# Patient Record
Sex: Male | Born: 1970 | Race: Black or African American | Hispanic: No | Marital: Single | Smoking: Former smoker
Health system: Southern US, Community
[De-identification: ages and names within clinical notes are randomized; demographics above are authoritative.]

## PROBLEM LIST (undated history)

## (undated) DIAGNOSIS — Z86718 Personal history of other venous thrombosis and embolism: Secondary | ICD-10-CM

## (undated) DIAGNOSIS — M5430 Sciatica, unspecified side: Secondary | ICD-10-CM

## (undated) DIAGNOSIS — Z21 Asymptomatic human immunodeficiency virus [HIV] infection status: Secondary | ICD-10-CM

## (undated) DIAGNOSIS — I219 Acute myocardial infarction, unspecified: Secondary | ICD-10-CM

## (undated) DIAGNOSIS — S129XXA Fracture of neck, unspecified, initial encounter: Secondary | ICD-10-CM

## (undated) DIAGNOSIS — I340 Nonrheumatic mitral (valve) insufficiency: Secondary | ICD-10-CM

## (undated) DIAGNOSIS — B20 Human immunodeficiency virus [HIV] disease: Secondary | ICD-10-CM

## (undated) DIAGNOSIS — I2510 Atherosclerotic heart disease of native coronary artery without angina pectoris: Secondary | ICD-10-CM

## (undated) HISTORY — PX: OTHER SURGICAL HISTORY: SHX169

## (undated) HISTORY — PX: NECK SURGERY: SHX720

## (undated) HISTORY — PX: CARDIAC SURGERY: SHX584

---

## 2001-12-01 ENCOUNTER — Encounter: Payer: Self-pay | Admitting: Neurosurgery

## 2001-12-01 ENCOUNTER — Ambulatory Visit (HOSPITAL_COMMUNITY): Admission: RE | Admit: 2001-12-01 | Discharge: 2001-12-02 | Payer: Self-pay | Admitting: Neurosurgery

## 2002-04-12 ENCOUNTER — Encounter: Admission: RE | Admit: 2002-04-12 | Discharge: 2002-04-16 | Payer: Self-pay | Admitting: Neurosurgery

## 2004-03-07 ENCOUNTER — Other Ambulatory Visit: Payer: Self-pay

## 2004-09-18 ENCOUNTER — Emergency Department: Payer: Self-pay | Admitting: Emergency Medicine

## 2004-12-15 ENCOUNTER — Emergency Department: Payer: Self-pay | Admitting: Emergency Medicine

## 2005-03-05 ENCOUNTER — Emergency Department: Payer: Self-pay | Admitting: Emergency Medicine

## 2005-12-24 ENCOUNTER — Emergency Department: Payer: Self-pay | Admitting: Emergency Medicine

## 2007-02-08 ENCOUNTER — Emergency Department: Payer: Self-pay | Admitting: General Practice

## 2007-02-15 ENCOUNTER — Ambulatory Visit: Payer: Self-pay | Admitting: General Practice

## 2007-04-27 ENCOUNTER — Ambulatory Visit: Payer: Self-pay | Admitting: General Practice

## 2007-05-09 ENCOUNTER — Ambulatory Visit: Payer: Self-pay | Admitting: General Practice

## 2008-01-12 ENCOUNTER — Ambulatory Visit: Payer: Self-pay | Admitting: General Practice

## 2008-01-25 ENCOUNTER — Emergency Department: Payer: Self-pay | Admitting: Emergency Medicine

## 2008-01-25 ENCOUNTER — Inpatient Hospital Stay: Payer: Self-pay | Admitting: Vascular Surgery

## 2008-01-28 ENCOUNTER — Emergency Department: Payer: Self-pay | Admitting: Emergency Medicine

## 2008-02-18 ENCOUNTER — Emergency Department: Payer: Self-pay | Admitting: Emergency Medicine

## 2008-06-07 ENCOUNTER — Ambulatory Visit: Payer: Self-pay | Admitting: General Practice

## 2009-03-10 ENCOUNTER — Emergency Department: Payer: Self-pay | Admitting: Emergency Medicine

## 2009-03-20 ENCOUNTER — Ambulatory Visit: Payer: Self-pay | Admitting: General Practice

## 2009-05-17 ENCOUNTER — Emergency Department: Payer: Self-pay | Admitting: Emergency Medicine

## 2009-08-20 IMAGING — CT CT CERVICAL SPINE WITHOUT CONTRAST
1 series · 12 of 14 positions shown, 15 images · non-contrast
Comparison: none

REASON FOR EXAM: Neck Pain  Hx Cervical Fusion
COMMENTS:

PROCEDURE:     CT  - CT CERVICAL SPINE WO  - March 20, 2009  [DATE]
RESULT:
HISTORY: Cervical spine fusion, neck pain.

[Series 6: axial · axial · 0.31mm/px · z∈[+701,+860]mm · 12 of 98 slices shown, 15 images]
[im 8/98  soft-tissue]
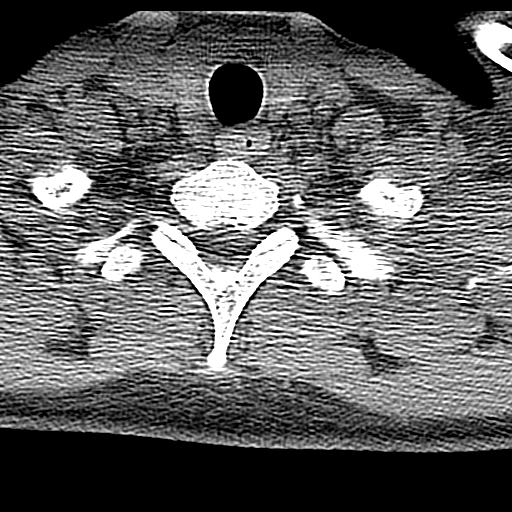
[im 8/98  bone]
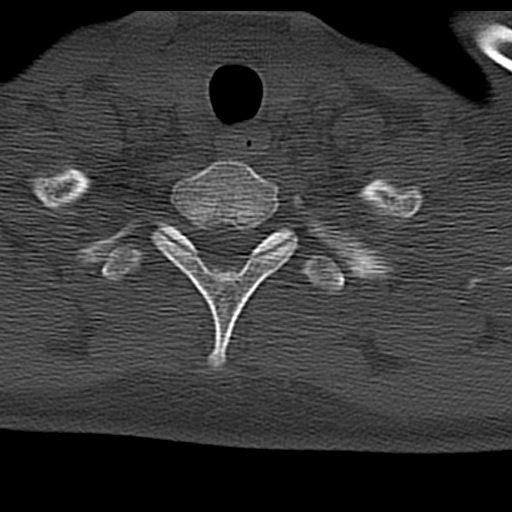
[im 15/98  bone]
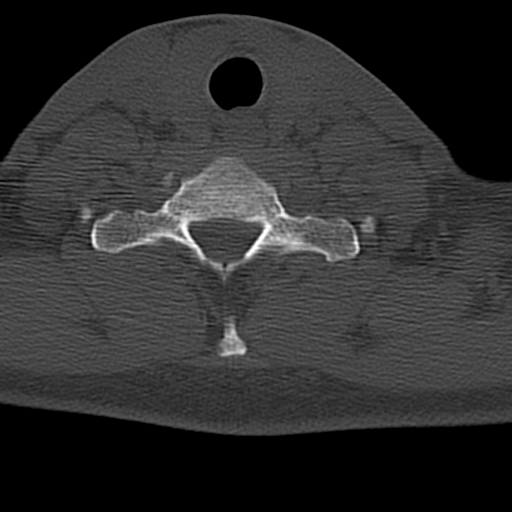
[im 23/98  bone]
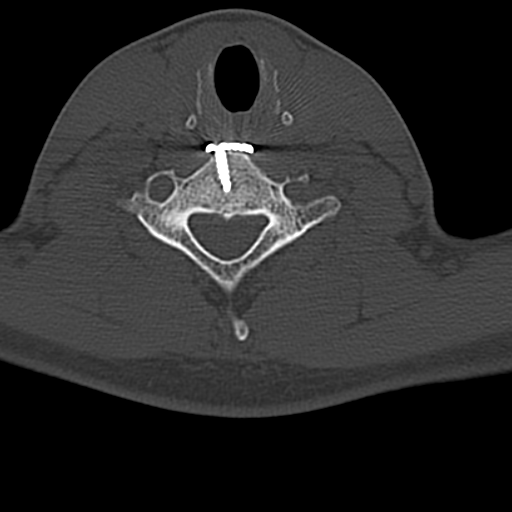
[im 30/98  bone]
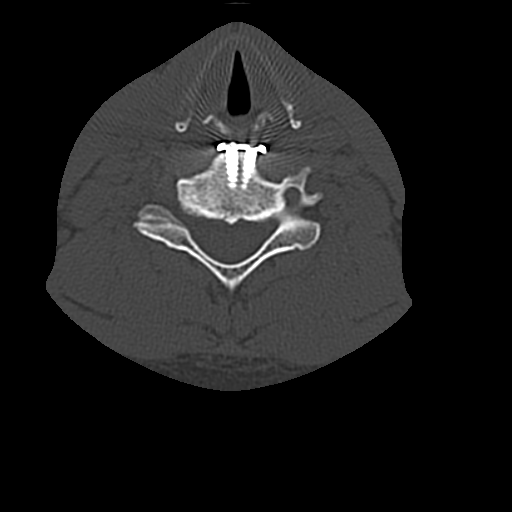
[im 38/98  soft-tissue]
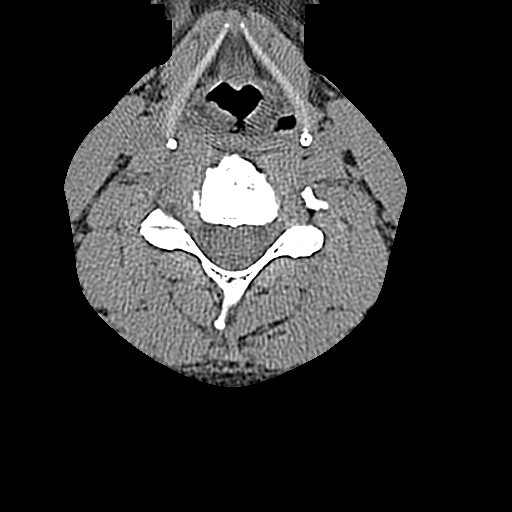
[im 38/98  bone]
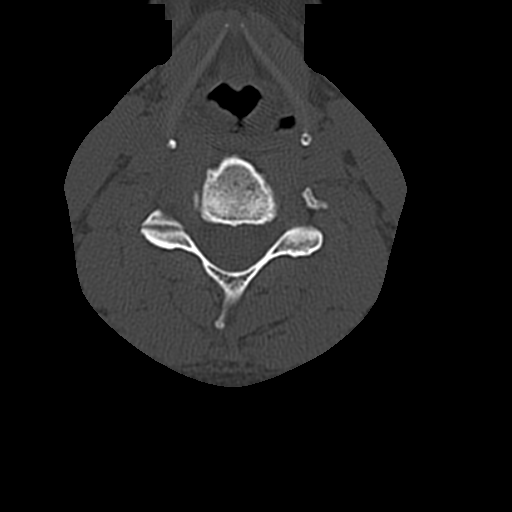
[im 45/98  bone]
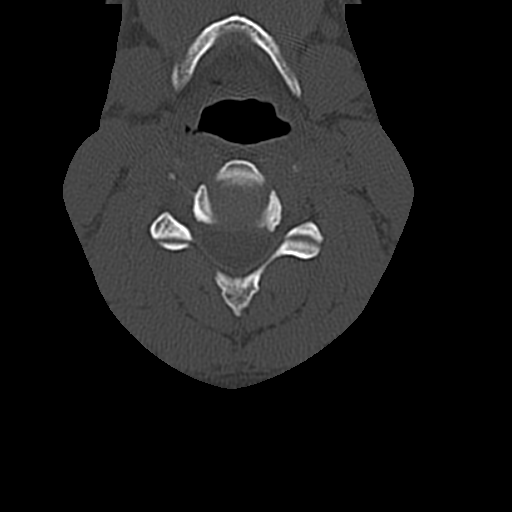
[im 53/98  bone]
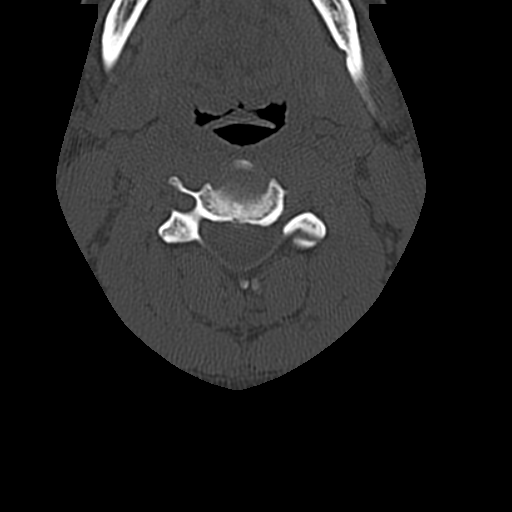
[im 60/98  bone]
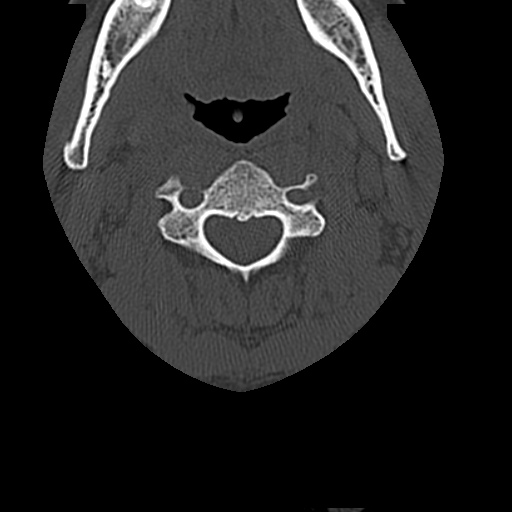
[im 68/98  soft-tissue]
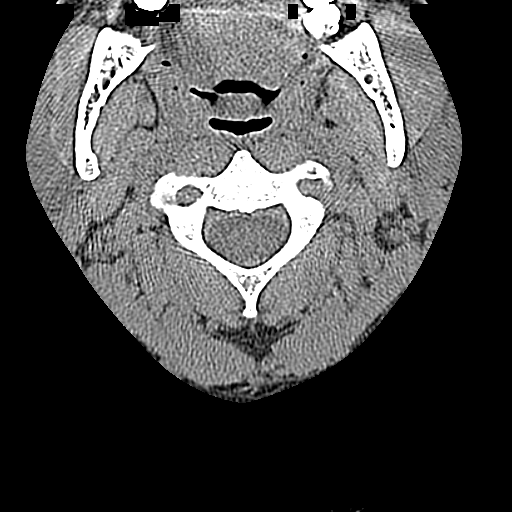
[im 68/98  bone]
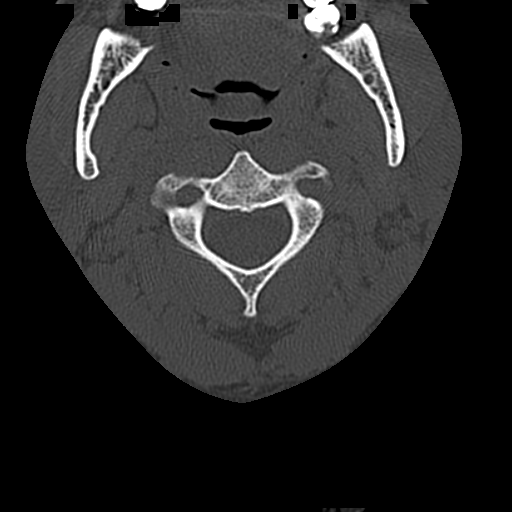
[im 75/98  bone]
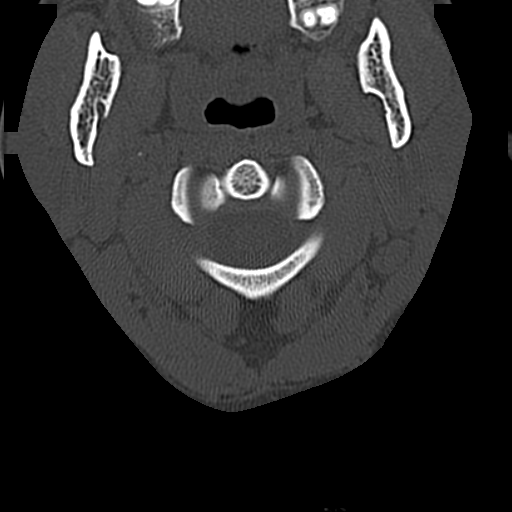
[im 83/98  bone]
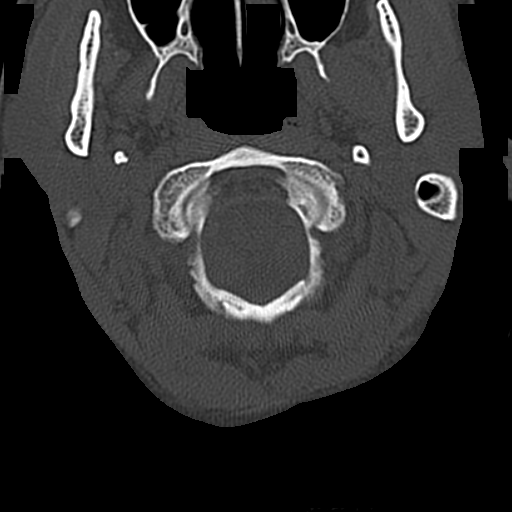
[im 90/98  bone]
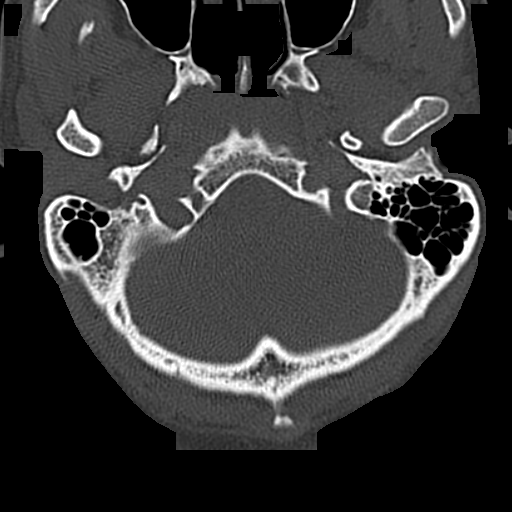

[12 of 14 positions shown; findings below may reference images not displayed]

COMPARISON STUDIES:  No recent.

PROCEDURE AND FINDINGS:  Standard CT of the cervical spine is obtained.

The patient has undergone prior C6-C7 fusion. Noted at the level of fusion
are cortical lucencies. These are most likely from the patient's prior
surgery and adjacent degenerative changes. No soft tissue swelling is noted
to suggest acute fracture. Degenerative changes are present about the
cervical spine. The neural foramina demonstrate no bony occlusion.
IMPRESSION: 1.     The patient has undergone prior C6-C7 fusion with good anatomic
alignment.
2.     Diffuse degenerative change. No acute abnormality.

## 2010-03-26 ENCOUNTER — Emergency Department: Payer: Self-pay | Admitting: Emergency Medicine

## 2010-04-14 ENCOUNTER — Emergency Department: Payer: Self-pay | Admitting: Emergency Medicine

## 2010-06-06 ENCOUNTER — Emergency Department: Payer: Self-pay | Admitting: Emergency Medicine

## 2010-09-01 ENCOUNTER — Emergency Department: Payer: Self-pay | Admitting: Emergency Medicine

## 2010-11-11 ENCOUNTER — Emergency Department: Payer: Self-pay | Admitting: Emergency Medicine

## 2011-02-09 ENCOUNTER — Emergency Department: Payer: Self-pay | Admitting: Emergency Medicine

## 2011-05-30 ENCOUNTER — Emergency Department: Payer: Self-pay | Admitting: Internal Medicine

## 2012-02-22 ENCOUNTER — Emergency Department: Payer: Self-pay | Admitting: Emergency Medicine

## 2012-02-22 LAB — BASIC METABOLIC PANEL
Anion Gap: 4 — ABNORMAL LOW (ref 7–16)
BUN: 10 mg/dL (ref 7–18)
Calcium, Total: 8.7 mg/dL (ref 8.5–10.1)
Chloride: 99 mmol/L (ref 98–107)
Co2: 30 mmol/L (ref 21–32)
Creatinine: 0.9 mg/dL (ref 0.60–1.30)
EGFR (African American): >60
EGFR (Non-African Amer.): >60
Glucose: 94 mg/dL (ref 65–99)
Osmolality: 265 (ref 275–301)
Potassium: 3.3 mmol/L — ABNORMAL LOW (ref 3.5–5.1)
Sodium: 133 mmol/L — ABNORMAL LOW (ref 136–145)

## 2012-02-22 LAB — URINALYSIS, COMPLETE
Blood: NEGATIVE
Ph: 6 (ref 4.5–8.0)
Specific Gravity: 1.038 (ref 1.003–1.030)
Squamous Epithelial: <1
WBC UR: <1 /HPF (ref 0–5)

## 2012-02-22 LAB — LIPASE, BLOOD: Lipase: 94 U/L (ref 73–393)

## 2012-02-22 LAB — APTT: Activated PTT: 27.5 secs (ref 23.6–35.9)

## 2012-02-22 LAB — PROTIME-INR: Prothrombin Time: 13.7 secs (ref 11.5–14.7)

## 2012-02-22 LAB — TROPONIN I: Troponin-I: <0.02 ng/mL

## 2012-02-22 LAB — CBC
HCT: 41.3 % (ref 40.0–52.0)
HGB: 13.6 g/dL (ref 13.0–18.0)
MCH: 27.7 pg (ref 26.0–34.0)
MCHC: 33 g/dL (ref 32.0–36.0)
MCV: 84 fL (ref 80–100)
Platelet: 118 10*3/uL — ABNORMAL LOW (ref 150–440)
RBC: 4.92 10*6/uL (ref 4.40–5.90)
RDW: 13.6 % (ref 11.5–14.5)
WBC: 4.3 10*3/uL (ref 3.8–10.6)

## 2012-02-22 LAB — CK TOTAL AND CKMB (NOT AT ARMC): CK-MB: <0.5 ng/mL — ABNORMAL LOW (ref 0.5–3.6)

## 2012-06-05 ENCOUNTER — Emergency Department: Payer: Self-pay | Admitting: Emergency Medicine

## 2012-06-05 LAB — CK TOTAL AND CKMB (NOT AT ARMC)
CK, Total: 263 U/L — ABNORMAL HIGH (ref 35–232)
CK-MB: 1.1 ng/mL (ref 0.5–3.6)

## 2012-06-05 LAB — COMPREHENSIVE METABOLIC PANEL
Albumin: 3.9 g/dL (ref 3.4–5.0)
Alkaline Phosphatase: 115 U/L (ref 50–136)
Anion Gap: 4 — ABNORMAL LOW (ref 7–16)
BUN: 12 mg/dL (ref 7–18)
Bilirubin,Total: 0.3 mg/dL (ref 0.2–1.0)
Calcium, Total: 9 mg/dL (ref 8.5–10.1)
Chloride: 104 mmol/L (ref 98–107)
Co2: 29 mmol/L (ref 21–32)
Creatinine: 1.09 mg/dL (ref 0.60–1.30)
EGFR (African American): >60
EGFR (Non-African Amer.): >60
Glucose: 83 mg/dL (ref 65–99)
Osmolality: 273 (ref 275–301)
Potassium: 4 mmol/L (ref 3.5–5.1)
SGOT(AST): 37 U/L (ref 15–37)
SGPT (ALT): 19 U/L (ref 12–78)
Sodium: 137 mmol/L (ref 136–145)
Total Protein: 9.3 g/dL — ABNORMAL HIGH (ref 6.4–8.2)

## 2012-06-05 LAB — PROTIME-INR
INR: 0.9
Prothrombin Time: 12.4 secs (ref 11.5–14.7)

## 2012-06-05 LAB — CBC
HCT: 42.6 % (ref 40.0–52.0)
HGB: 13.7 g/dL (ref 13.0–18.0)
MCH: 27.4 pg (ref 26.0–34.0)
MCHC: 32.1 g/dL (ref 32.0–36.0)
MCV: 85 fL (ref 80–100)
Platelet: 149 10*3/uL — ABNORMAL LOW (ref 150–440)
RBC: 4.99 10*6/uL (ref 4.40–5.90)
RDW: 14.1 % (ref 11.5–14.5)
WBC: 3.3 10*3/uL — ABNORMAL LOW (ref 3.8–10.6)

## 2012-06-05 LAB — TROPONIN I
Troponin-I: <0.02 ng/mL
Troponin-I: <0.02 ng/mL

## 2012-06-05 LAB — APTT: Activated PTT: 25.8 secs (ref 23.6–35.9)

## 2013-01-07 ENCOUNTER — Emergency Department: Payer: Self-pay | Admitting: Unknown Physician Specialty

## 2013-01-07 LAB — COMPREHENSIVE METABOLIC PANEL
Albumin: 3.5 g/dL (ref 3.4–5.0)
Alkaline Phosphatase: 96 U/L (ref 50–136)
Bilirubin,Total: 0.2 mg/dL (ref 0.2–1.0)
Chloride: 106 mmol/L (ref 98–107)
Co2: 28 mmol/L (ref 21–32)
Creatinine: 0.87 mg/dL (ref 0.60–1.30)
EGFR (Non-African Amer.): >60
Glucose: 105 mg/dL — ABNORMAL HIGH (ref 65–99)
Potassium: 3.4 mmol/L — ABNORMAL LOW (ref 3.5–5.1)
SGOT(AST): 36 U/L (ref 15–37)
SGPT (ALT): 21 U/L (ref 12–78)
Sodium: 139 mmol/L (ref 136–145)
Total Protein: 8.3 g/dL — ABNORMAL HIGH (ref 6.4–8.2)

## 2013-01-07 LAB — TROPONIN I: Troponin-I: <0.02 ng/mL

## 2013-01-07 LAB — APTT: Activated PTT: 28.6 secs (ref 23.6–35.9)

## 2013-01-07 LAB — CBC
HCT: 37.7 % — ABNORMAL LOW (ref 40.0–52.0)
HGB: 12.2 g/dL — ABNORMAL LOW (ref 13.0–18.0)
MCV: 86 fL (ref 80–100)
Platelet: 127 10*3/uL — ABNORMAL LOW (ref 150–440)
RBC: 4.41 10*6/uL (ref 4.40–5.90)

## 2013-01-12 LAB — CULTURE, BLOOD (SINGLE)

## 2013-12-26 ENCOUNTER — Emergency Department: Payer: Self-pay | Admitting: Emergency Medicine

## 2014-02-14 ENCOUNTER — Emergency Department: Payer: Self-pay | Admitting: Emergency Medicine

## 2014-02-14 LAB — URINALYSIS, COMPLETE
BLOOD: NEGATIVE
Bacteria: NONE SEEN
Bilirubin,UR: NEGATIVE
Glucose,UR: NEGATIVE mg/dL (ref 0–75)
Hyaline Cast: 3
LEUKOCYTE ESTERASE: NEGATIVE
Nitrite: NEGATIVE
Ph: 5 (ref 4.5–8.0)
Protein: NEGATIVE
RBC,UR: 11 /HPF (ref 0–5)
Specific Gravity: 1.024 (ref 1.003–1.030)
WBC UR: 2 /HPF (ref 0–5)

## 2014-02-14 LAB — BASIC METABOLIC PANEL
Anion Gap: 9 (ref 7–16)
BUN: 34 mg/dL — ABNORMAL HIGH (ref 7–18)
CALCIUM: 8.5 mg/dL (ref 8.5–10.1)
CHLORIDE: 103 mmol/L (ref 98–107)
Co2: 26 mmol/L (ref 21–32)
Creatinine: 0.87 mg/dL (ref 0.60–1.30)
EGFR (African American): >60
EGFR (Non-African Amer.): >60
Glucose: 87 mg/dL (ref 65–99)
OSMOLALITY: 283 (ref 275–301)
POTASSIUM: 3.7 mmol/L (ref 3.5–5.1)
Sodium: 138 mmol/L (ref 136–145)

## 2014-02-14 LAB — CBC
HCT: 27.5 % — ABNORMAL LOW (ref 40.0–52.0)
HGB: 8.8 g/dL — ABNORMAL LOW (ref 13.0–18.0)
MCH: 26.7 pg (ref 26.0–34.0)
MCHC: 31.8 g/dL — AB (ref 32.0–36.0)
MCV: 84 fL (ref 80–100)
PLATELETS: 121 10*3/uL — AB (ref 150–440)
RBC: 3.28 10*6/uL — ABNORMAL LOW (ref 4.40–5.90)
RDW: 14.3 % (ref 11.5–14.5)
WBC: 4.3 10*3/uL (ref 3.8–10.6)

## 2014-02-14 LAB — TROPONIN I: Troponin-I: <0.02 ng/mL

## 2014-02-14 LAB — LIPASE, BLOOD: Lipase: 88 U/L (ref 73–393)

## 2014-02-19 LAB — CULTURE, BLOOD (SINGLE)

## 2014-12-07 ENCOUNTER — Emergency Department: Payer: Self-pay | Admitting: Emergency Medicine

## 2016-10-11 ENCOUNTER — Encounter: Payer: Self-pay | Admitting: Emergency Medicine

## 2016-10-11 ENCOUNTER — Emergency Department
Admission: EM | Admit: 2016-10-11 | Discharge: 2016-10-12 | Disposition: A | Discharge status: Home / Self Care | Payer: Medicare Other | Attending: Emergency Medicine | Admitting: Emergency Medicine

## 2016-10-11 DIAGNOSIS — Z21 Asymptomatic human immunodeficiency virus [HIV] infection status: Secondary | ICD-10-CM | POA: Insufficient documentation

## 2016-10-11 DIAGNOSIS — M5441 Lumbago with sciatica, right side: Secondary | ICD-10-CM | POA: Insufficient documentation

## 2016-10-11 DIAGNOSIS — Z87891 Personal history of nicotine dependence: Secondary | ICD-10-CM | POA: Diagnosis not present

## 2016-10-11 DIAGNOSIS — M545 Low back pain: Secondary | ICD-10-CM | POA: Diagnosis present

## 2016-10-11 HISTORY — DX: Fracture of neck, unspecified, initial encounter: S12.9XXA

## 2016-10-11 HISTORY — DX: Personal history of other venous thrombosis and embolism: Z86.718

## 2016-10-11 HISTORY — DX: Sciatica, unspecified side: M54.30

## 2016-10-11 MED ORDER — METHYLPREDNISOLONE SODIUM SUCC 125 MG IJ SOLR
125.0000 mg | Freq: Once | INTRAMUSCULAR | Status: AC
  Administered 2016-10-11: 125 mg via INTRAVENOUS
  Filled 2016-10-11: qty 2

## 2016-10-11 MED ORDER — HYDROMORPHONE HCL 1 MG/ML IJ SOLN
1.0000 mg | Freq: Once | INTRAMUSCULAR | Status: AC
  Administered 2016-10-11: 1 mg via INTRAVENOUS
  Filled 2016-10-11: qty 1

## 2016-10-11 MED ORDER — KETOROLAC TROMETHAMINE 30 MG/ML IJ SOLN
30.0000 mg | Freq: Once | INTRAMUSCULAR | Status: AC
  Administered 2016-10-11: 30 mg via INTRAVENOUS
  Filled 2016-10-11: qty 1

## 2016-10-11 MED ORDER — DIAZEPAM 5 MG PO TABS
5.0000 mg | ORAL_TABLET | Freq: Once | ORAL | Status: AC
  Administered 2016-10-11: 5 mg via ORAL
  Filled 2016-10-11: qty 1

## 2016-10-11 NOTE — ED Triage Notes (Signed)
Pt presents from home via [facility] with c/o mid back radiating down bilateral legs. Pt reports hx of sciatic nerve pain, reports pain has increased. Pt states took oxycotin 6 hours PTA without relief. Per EMS pt given 4 mg of Zofran and 100 mcg of Fentanyl without relief.
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
## 2016-10-12 DIAGNOSIS — M5441 Lumbago with sciatica, right side: Secondary | ICD-10-CM | POA: Diagnosis not present

## 2016-10-12 MED ORDER — DIAZEPAM 5 MG PO TABS: 5.0000 mg | ORAL_TABLET | Freq: Three times a day (TID) | ORAL | 0 refills | Status: DC | PRN

## 2016-10-12 MED ORDER — OXYCODONE-ACETAMINOPHEN 5-325 MG PO TABS: 1.0000 | ORAL_TABLET | ORAL | 0 refills | Status: DC | PRN

## 2016-10-12 MED ORDER — OXYCODONE-ACETAMINOPHEN 5-325 MG PO TABS
1.0000 | ORAL_TABLET | Freq: Once | ORAL | Status: AC
  Administered 2016-10-12: 1 via ORAL
  Filled 2016-10-12: qty 1

## 2016-10-12 MED ORDER — IBUPROFEN 800 MG PO TABS: 800.0000 mg | ORAL_TABLET | Freq: Three times a day (TID) | ORAL | 0 refills | Status: DC | PRN

## 2016-10-12 MED ORDER — METHYLPREDNISOLONE 4 MG PO TBPK: ORAL_TABLET | ORAL | 0 refills | Status: DC

## 2016-10-12 NOTE — ED Notes (Signed)

## 2016-10-12 NOTE — ED Notes (Addendum)
Upon this RN entry to room patient sleeping, lights off for patient comfort, respirations even and unlabored.

## 2016-10-12 NOTE — ED Notes (Signed)
Pt requested that his shirts be cut off instead of taking them off. This RN asked again if he really wanted white shirt and flannel cut off and pt again told this RN just to cut them off. Both shirts placed in a chair in room along with a set of keys.

## 2016-10-12 NOTE — Discharge Instructions (Signed)
1. You may take medicines as needed for pain and muscle spasms (Motrin/Percocet/Valium #15). 2. Take steroid taper as directed (Medrol Dosepak). 3. Return to the ER for worsening symptoms, persistent vomiting, difficulty breathing or other concerns.

## 2016-10-13 ENCOUNTER — Emergency Department
Admission: EM | Admit: 2016-10-13 | Discharge: 2016-10-13 | Disposition: A | Discharge status: Other Acute Inpatient Hospital | Payer: Medicare Other | Attending: Emergency Medicine | Admitting: Emergency Medicine

## 2016-10-13 ENCOUNTER — Emergency Department: Payer: Medicare Other

## 2016-10-13 ENCOUNTER — Encounter: Payer: Self-pay | Admitting: Emergency Medicine

## 2016-10-13 DIAGNOSIS — R1084 Generalized abdominal pain: Secondary | ICD-10-CM | POA: Insufficient documentation

## 2016-10-13 DIAGNOSIS — Z87891 Personal history of nicotine dependence: Secondary | ICD-10-CM | POA: Insufficient documentation

## 2016-10-13 DIAGNOSIS — Z791 Long term (current) use of non-steroidal anti-inflammatories (NSAID): Secondary | ICD-10-CM | POA: Diagnosis not present

## 2016-10-13 DIAGNOSIS — K922 Gastrointestinal hemorrhage, unspecified: Secondary | ICD-10-CM | POA: Insufficient documentation

## 2016-10-13 DIAGNOSIS — E860 Dehydration: Secondary | ICD-10-CM | POA: Diagnosis not present

## 2016-10-13 DIAGNOSIS — R531 Weakness: Secondary | ICD-10-CM

## 2016-10-13 DIAGNOSIS — N179 Acute kidney failure, unspecified: Secondary | ICD-10-CM | POA: Insufficient documentation

## 2016-10-13 DIAGNOSIS — Z21 Asymptomatic human immunodeficiency virus [HIV] infection status: Secondary | ICD-10-CM | POA: Diagnosis not present

## 2016-10-13 DIAGNOSIS — Z79899 Other long term (current) drug therapy: Secondary | ICD-10-CM | POA: Insufficient documentation

## 2016-10-13 DIAGNOSIS — K920 Hematemesis: Secondary | ICD-10-CM | POA: Diagnosis present

## 2016-10-13 HISTORY — DX: Asymptomatic human immunodeficiency virus (hiv) infection status: Z21

## 2016-10-13 HISTORY — DX: Human immunodeficiency virus (HIV) disease: B20

## 2016-10-13 LAB — COMPREHENSIVE METABOLIC PANEL
ALBUMIN: 3.7 g/dL (ref 3.5–5.0)
ALT: 17 U/L (ref 17–63)
AST: 42 U/L — AB (ref 15–41)
Alkaline Phosphatase: 62 U/L (ref 38–126)
Anion gap: 17 — ABNORMAL HIGH (ref 5–15)
BUN: 61 mg/dL — AB (ref 6–20)
CHLORIDE: 100 mmol/L — AB (ref 101–111)
CO2: 20 mmol/L — AB (ref 22–32)
CREATININE: 1.76 mg/dL — AB (ref 0.61–1.24)
Calcium: 8.9 mg/dL (ref 8.9–10.3)
GFR calc Af Amer: 51 mL/min — ABNORMAL LOW (ref >60)
GFR, EST NON AFRICAN AMERICAN: 44 mL/min — AB (ref >60)
GLUCOSE: 221 mg/dL — AB (ref 65–99)
POTASSIUM: 3.6 mmol/L (ref 3.5–5.1)
SODIUM: 137 mmol/L (ref 135–145)
Total Bilirubin: 0.9 mg/dL (ref 0.3–1.2)
Total Protein: 6.8 g/dL (ref 6.5–8.1)

## 2016-10-13 LAB — TYPE AND SCREEN
ABO/RH(D): B POS
Antibody Screen: NEGATIVE

## 2016-10-13 LAB — PROTIME-INR
INR: 1.3
Prothrombin Time: 16.3 seconds — ABNORMAL HIGH (ref 11.4–15.2)

## 2016-10-13 LAB — CBC
HEMATOCRIT: 29.2 % — AB (ref 40.0–52.0)
Hemoglobin: 9.4 g/dL — ABNORMAL LOW (ref 13.0–18.0)
MCH: 29 pg (ref 26.0–34.0)
MCHC: 32.2 g/dL (ref 32.0–36.0)
MCV: 89.9 fL (ref 80.0–100.0)
PLATELETS: 268 10*3/uL (ref 150–440)
RBC: 3.24 MIL/uL — ABNORMAL LOW (ref 4.40–5.90)
RDW: 15.7 % — AB (ref 11.5–14.5)
WBC: 18.7 10*3/uL — AB (ref 3.8–10.6)

## 2016-10-13 LAB — ETHANOL

## 2016-10-13 LAB — LIPASE, BLOOD: LIPASE: 11 U/L (ref 11–51)

## 2016-10-13 LAB — TROPONIN I: Troponin I: <0.03 ng/mL (ref <0.03)

## 2016-10-13 MED ORDER — SODIUM CHLORIDE 0.9 % IV BOLUS (SEPSIS): 1000.0000 mL | Freq: Once | INTRAVENOUS | Status: DC

## 2016-10-13 MED ORDER — FAMOTIDINE IN NACL 20-0.9 MG/50ML-% IV SOLN
20.0000 mg | Freq: Once | INTRAVENOUS | Status: AC
  Administered 2016-10-13: 20 mg via INTRAVENOUS
  Filled 2016-10-13: qty 50

## 2016-10-13 MED ORDER — SODIUM CHLORIDE 0.9 % IV BOLUS (SEPSIS)
1000.0000 mL | Freq: Once | INTRAVENOUS | Status: AC
  Administered 2016-10-13: 1000 mL via INTRAVENOUS

## 2016-10-13 MED ORDER — PANTOPRAZOLE SODIUM 40 MG IV SOLR: 40.0000 mg | Freq: Two times a day (BID) | INTRAVENOUS | Status: DC

## 2016-10-13 MED ORDER — ONDANSETRON HCL 4 MG/2ML IJ SOLN
4.0000 mg | Freq: Once | INTRAMUSCULAR | Status: AC
  Administered 2016-10-13: 4 mg via INTRAVENOUS
  Filled 2016-10-13: qty 2

## 2016-10-13 MED ORDER — SODIUM CHLORIDE 0.9 % IV SOLN
8.0000 mg/h | INTRAVENOUS | Status: DC
  Administered 2016-10-13: 8 mg/h via INTRAVENOUS
  Filled 2016-10-13: qty 80

## 2016-10-13 NOTE — ED Triage Notes (Signed)
Pt arrived to ED from home by EMS with c/o hematemesis since 1900 yesterday. EMS reports bright red blood in emesis on arrival with pt showing signs of generalized weakness. Pt has hx of blood clots, is currently taking blood thinners (unknown meds.)
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
## 2018-06-12 ENCOUNTER — Emergency Department
Admission: EM | Admit: 2018-06-12 | Discharge: 2018-06-12 | Disposition: A | Discharge status: Home / Self Care | Payer: Medicare Other | Attending: Emergency Medicine | Admitting: Emergency Medicine

## 2018-06-12 ENCOUNTER — Encounter: Payer: Self-pay | Admitting: Emergency Medicine

## 2018-06-12 DIAGNOSIS — Z87891 Personal history of nicotine dependence: Secondary | ICD-10-CM | POA: Insufficient documentation

## 2018-06-12 DIAGNOSIS — S39012A Strain of muscle, fascia and tendon of lower back, initial encounter: Secondary | ICD-10-CM | POA: Diagnosis not present

## 2018-06-12 DIAGNOSIS — Y92410 Unspecified street and highway as the place of occurrence of the external cause: Secondary | ICD-10-CM | POA: Insufficient documentation

## 2018-06-12 DIAGNOSIS — Z79899 Other long term (current) drug therapy: Secondary | ICD-10-CM | POA: Diagnosis not present

## 2018-06-12 DIAGNOSIS — Y999 Unspecified external cause status: Secondary | ICD-10-CM | POA: Insufficient documentation

## 2018-06-12 DIAGNOSIS — S3992XA Unspecified injury of lower back, initial encounter: Secondary | ICD-10-CM | POA: Diagnosis present

## 2018-06-12 DIAGNOSIS — Z7982 Long term (current) use of aspirin: Secondary | ICD-10-CM | POA: Diagnosis not present

## 2018-06-12 DIAGNOSIS — Y939 Activity, unspecified: Secondary | ICD-10-CM | POA: Diagnosis not present

## 2018-06-12 HISTORY — DX: Nonrheumatic mitral (valve) insufficiency: I34.0

## 2018-06-12 NOTE — Discharge Instructions (Signed)
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
## 2018-09-26 ENCOUNTER — Emergency Department
Admission: EM | Admit: 2018-09-26 | Discharge: 2018-09-26 | Disposition: A | Discharge status: Home / Self Care | Payer: Medicare Other | Attending: Emergency Medicine | Admitting: Emergency Medicine

## 2018-09-26 ENCOUNTER — Other Ambulatory Visit: Payer: Self-pay

## 2018-09-26 ENCOUNTER — Encounter: Payer: Self-pay | Admitting: Emergency Medicine

## 2018-09-26 ENCOUNTER — Emergency Department: Payer: Medicare Other

## 2018-09-26 DIAGNOSIS — R079 Chest pain, unspecified: Secondary | ICD-10-CM | POA: Diagnosis present

## 2018-09-26 DIAGNOSIS — Z5321 Procedure and treatment not carried out due to patient leaving prior to being seen by health care provider: Secondary | ICD-10-CM | POA: Diagnosis not present

## 2018-09-26 HISTORY — DX: Atherosclerotic heart disease of native coronary artery without angina pectoris: I25.10

## 2018-09-26 HISTORY — DX: Acute myocardial infarction, unspecified: I21.9

## 2018-09-26 LAB — CBC
HCT: 49 % (ref 39.0–52.0)
Hemoglobin: 15.7 g/dL (ref 13.0–17.0)
MCH: 29.6 pg (ref 26.0–34.0)
MCHC: 32 g/dL (ref 30.0–36.0)
MCV: 92.3 fL (ref 80.0–100.0)
NRBC: 0 % (ref 0.0–0.2)
Platelets: 280 10*3/uL (ref 150–400)
RBC: 5.31 MIL/uL (ref 4.22–5.81)
RDW: 13.3 % (ref 11.5–15.5)
WBC: 6.3 10*3/uL (ref 4.0–10.5)

## 2018-09-26 LAB — BASIC METABOLIC PANEL
Anion gap: 9 (ref 5–15)
BUN: 18 mg/dL (ref 6–20)
CALCIUM: 9.4 mg/dL (ref 8.9–10.3)
CO2: 24 mmol/L (ref 22–32)
CREATININE: 1.1 mg/dL (ref 0.61–1.24)
Chloride: 104 mmol/L (ref 98–111)
GFR calc non Af Amer: >60 mL/min (ref >60)
Glucose, Bld: 110 mg/dL — ABNORMAL HIGH (ref 70–99)
Potassium: 4.2 mmol/L (ref 3.5–5.1)
Sodium: 137 mmol/L (ref 135–145)

## 2018-09-26 LAB — TROPONIN I: Troponin I: <0.03 ng/mL (ref <0.03)

## 2018-09-26 NOTE — ED Triage Notes (Signed)
Stabbing chest pain started this am when she woke up.  Has had cough recently.  Tender to palpation left upper chest.  Says it makes her left arm feel numb.

## 2018-09-26 NOTE — ED Notes (Signed)
Pt to front desk c/o pain, states she has hardware in her chest and needs to be seen soon, explained that there are several people waiting and that there is another area of the ED opening soon that should be able to see patients to get wait times down. Pt stated that if they can't be seen soon [facility]  Pt placed back out into the lobby after VS checked and repeat EKG completed.

## 2021-07-14 ENCOUNTER — Other Ambulatory Visit: Payer: Self-pay | Admitting: Naturopath

## 2021-07-14 ENCOUNTER — Other Ambulatory Visit: Payer: Self-pay | Admitting: Internal Medicine

## 2021-07-14 DIAGNOSIS — R059 Cough, unspecified: Secondary | ICD-10-CM

## 2021-07-14 DIAGNOSIS — I743 Embolism and thrombosis of arteries of the lower extremities: Secondary | ICD-10-CM

## 2022-05-02 ENCOUNTER — Emergency Department: Payer: Medicare Other

## 2022-05-02 ENCOUNTER — Emergency Department
Admission: EM | Admit: 2022-05-02 | Discharge: 2022-05-02 | Disposition: A | Discharge status: Home / Self Care | Payer: Medicare Other | Attending: Emergency Medicine | Admitting: Emergency Medicine

## 2022-05-02 ENCOUNTER — Other Ambulatory Visit: Payer: Self-pay

## 2022-05-02 DIAGNOSIS — I700 Atherosclerosis of aorta: Secondary | ICD-10-CM | POA: Diagnosis not present

## 2022-05-02 DIAGNOSIS — Z79891 Long term (current) use of opiate analgesic: Secondary | ICD-10-CM | POA: Diagnosis not present

## 2022-05-02 DIAGNOSIS — J441 Chronic obstructive pulmonary disease with (acute) exacerbation: Secondary | ICD-10-CM | POA: Diagnosis not present

## 2022-05-02 DIAGNOSIS — R29818 Other symptoms and signs involving the nervous system: Secondary | ICD-10-CM | POA: Diagnosis not present

## 2022-05-02 DIAGNOSIS — Z21 Asymptomatic human immunodeficiency virus [HIV] infection status: Secondary | ICD-10-CM | POA: Diagnosis not present

## 2022-05-02 DIAGNOSIS — F17210 Nicotine dependence, cigarettes, uncomplicated: Secondary | ICD-10-CM | POA: Diagnosis not present

## 2022-05-02 DIAGNOSIS — I2510 Atherosclerotic heart disease of native coronary artery without angina pectoris: Secondary | ICD-10-CM | POA: Insufficient documentation

## 2022-05-02 DIAGNOSIS — R0602 Shortness of breath: Secondary | ICD-10-CM

## 2022-05-02 DIAGNOSIS — R7989 Other specified abnormal findings of blood chemistry: Secondary | ICD-10-CM | POA: Diagnosis not present

## 2022-05-02 LAB — CBC
HCT: 47.1 % (ref 39.0–52.0)
Hemoglobin: 15 g/dL (ref 13.0–17.0)
MCH: 28.5 pg (ref 26.0–34.0)
MCHC: 31.8 g/dL (ref 30.0–36.0)
MCV: 89.5 fL (ref 80.0–100.0)
Platelets: 248 10*3/uL (ref 150–400)
RBC: 5.26 MIL/uL (ref 4.22–5.81)
RDW: 13.4 % (ref 11.5–15.5)
WBC: 5 10*3/uL (ref 4.0–10.5)
nRBC: 0 % (ref 0.0–0.2)

## 2022-05-02 LAB — BASIC METABOLIC PANEL
Anion gap: 8 (ref 5–15)
BUN: 12 mg/dL (ref 6–20)
CO2: 26 mmol/L (ref 22–32)
Calcium: 9.6 mg/dL (ref 8.9–10.3)
Chloride: 105 mmol/L (ref 98–111)
Creatinine, Ser: 1.27 mg/dL — ABNORMAL HIGH (ref 0.61–1.24)
GFR, Estimated: >60 mL/min (ref >60)
Glucose, Bld: 110 mg/dL — ABNORMAL HIGH (ref 70–99)
Potassium: 3.9 mmol/L (ref 3.5–5.1)
Sodium: 139 mmol/L (ref 135–145)

## 2022-05-02 LAB — CBG MONITORING, ED: Glucose-Capillary: 112 mg/dL — ABNORMAL HIGH (ref 70–99)

## 2022-05-02 LAB — BRAIN NATRIURETIC PEPTIDE: B Natriuretic Peptide: 6 pg/mL (ref 0.0–100.0)

## 2022-05-02 LAB — TROPONIN I (HIGH SENSITIVITY): Troponin I (High Sensitivity): 5 ng/L (ref <18)

## 2022-05-02 LAB — T4, FREE: Free T4: 0.78 ng/dL (ref 0.61–1.12)

## 2022-05-02 LAB — TSH: TSH: 1.151 u[IU]/mL (ref 0.350–4.500)

## 2022-05-02 MED ORDER — DOXYCYCLINE MONOHYDRATE 100 MG PO TABS: 100.0000 mg | ORAL_TABLET | Freq: Two times a day (BID) | ORAL | 0 refills | Status: DC

## 2022-05-02 MED ORDER — PREDNISONE 20 MG PO TABS: 40.0000 mg | ORAL_TABLET | Freq: Every day | ORAL | 0 refills | Status: AC

## 2022-05-02 MED ORDER — IOHEXOL 350 MG/ML SOLN
75.0000 mL | Freq: Once | INTRAVENOUS | Status: AC | PRN
  Administered 2022-05-02: 75 mL via INTRAVENOUS

## 2022-05-02 MED ORDER — AZITHROMYCIN 250 MG PO TABS: ORAL_TABLET | ORAL | 0 refills | Status: AC

## 2022-05-02 MED ORDER — PREDNISONE 20 MG PO TABS: 40.0000 mg | ORAL_TABLET | Freq: Every day | ORAL | 0 refills | Status: DC

## 2022-05-02 NOTE — ED Triage Notes (Signed)
Pt comes with c/o sob that started month ago. Pt states trouble breathing when laying down. Pt states left arm numbness that started week ago.

## 2022-05-02 NOTE — ED Provider Notes (Addendum)
[facility] Provider Note    Event Date/Time   First MD Initiated Contact with Patient 05/02/22 1224     (approximate)   History   Shortness of Breath   HPI  Jose Hodge is a 51 y.o. 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  Patient is currently on Plavix 75 mg secondary to peripheral vascular disease.  Patient reports 1 month of shortness of breath.  Reports that started about a month ago and that when patient lays flat they notice it is a little bit worse.  They report some intermittent severe non productive coughing associated with that.  As well as some intermittent left arm tingling.  However they do report they have had some of this left arm tingling in the past related to a cervical spine injury.  Denies that the tingling is constant but only last for a few minutes and then goes away.  Denies having it previously.  No chest pain.  Patient's prior PE was in the setting of being on estrogen therapy and is no longer on that.  Patient does report going through a pack of cigarettes every 2 to 3 days.  Physical Exam   Triage Vital Signs: ED Triage Vitals  Enc Vitals Group     BP 05/02/22 1105 132/88     Pulse Rate 05/02/22 1105 (!) 125     Resp 05/02/22 1105 18     Temp 05/02/22 1105 98.1 F (36.7 C)     Temp src --      SpO2 05/02/22 1105 98 %     Weight --      Height --      Head Circumference --      Peak Flow --      Pain Score 05/02/22 1104 7     Pain Loc --      Pain Edu? --      Excl. in GC? --     Most recent vital signs: Vitals:   05/02/22 1105  BP: 132/88  Pulse: (!) 125  Resp: 18  Temp: 98.1 F (36.7 C)  SpO2: 98%     General: Awake, no distress.  CV:  Good peripheral perfusion.  Resp:  Normal effort.  No wheezing noted Abd:  No distention.  Other:  No swelling the legs.   No calf tenderness.  Equal strength in arms and legs.  Cranial nerves appear intact.  No tingling or sensation changes at this time.   ED Results / Procedures / Treatments   Labs (all labs ordered are listed, but only abnormal results are displayed) Labs Reviewed  BASIC METABOLIC PANEL - Abnormal; Notable for the following components:      Result Value   Glucose, Bld 110 (*)    Creatinine, Ser 1.27 (*)    All other components within normal limits  CBG MONITORING, ED - Abnormal; Notable for the following components:   Glucose-Capillary 112 (*)    All other components within normal limits  CBC  BRAIN NATRIURETIC PEPTIDE  TSH  T4, FREE  TROPONIN I (HIGH SENSITIVITY)     EKG  My interpretation of EKG:  Sinus tachycardia rate of 108 without any ST elevation or T wave inversions except for inverted p wave in aVL, normal intervals  RADIOLOGY I have reviewed the xray personally and interpreted and no evidence  of any pneumonia   PROCEDURES:  Critical Care performed: No  Procedures   MEDICATIONS ORDERED IN ED: Medications  iohexol (OMNIPAQUE) 350 MG/ML injection 75 mL (has no administration in time range)     IMPRESSION / MDM / ASSESSMENT AND PLAN / ED COURSE  I reviewed the triage vital signs and the nursing notes.   Patient's presentation is most consistent with acute presentation with potential threat to life or bodily function.  Differential includes PE, pneumonia, COPD do not hear any obvious wheezing right now but I wonder if it could be related to the smoking history.  Patient is HIV positive but well controlled with normal CD4 send out infectious opportunistic infection related to this.  We will add on CT head given the intermittent numbness and tingling but patient does report prior neck injury and is had this previously so I suspect this more likely related to that but will make sure no evidence of intracranial mass or obvious stroke given we may need to restart  anticoagulation.  CBC no anemia.  BMP slightly elevated creatinine at 1.27  Trope negative.  BNP negative.  Thyroid reassuring  CT head negative CT PE-- IMPRESSION: 1. No pulmonary embolism.  No active pulmonary disease. 2. One vessel coronary atherosclerosis. 3. Aortic Atherosclerosis (ICD10-I70.0) and Emphysema (ICD10-J43.9).    Discussed with patient the CT imaging.  Given the atherosclerosis in the 1 vessel and not sure if this is the same vessel he has had a prior stent put in so recommended that she call his cardiologist to get follow-up this week I placed a referral for our cardiology team if she is unable to.  We did discuss today time that this could become occluded and that if there is an acute change of shortness of breath or chest pain to return to the ER immediately for repeat troponin.  However at this time she felt comfortable with following up outpatient with her cardiologist.  She reported that she mostly wanted to make sure she did not have a pulmonary embolism which was negative.    Also explained that some of the shortness of breath and coughing could be from emphysema given she reports a lot of coughing associated with it.  I did review and patient's prior HIV was done in March and was undetectable and CD4 was 400 so unlikely PCP pneumonia or anything like that but given the significant smoking history recommended trying to cut down on smoking we will trial some steroids and doxycycline.  Patient expressed understanding felt comfortable with this plan.  She understands that if she develops acutely changing chest pain or shortness of breath she is return to the ER immediately for repeat evaluation.  Considered admission but given heart score is 3 and patient's been having the symptoms for 1 or a month patient feels comfortable with discharge home and following up with his cardiologist at [facility]  The patient is on the cardiac monitor to evaluate for evidence of arrhythmia and/or  significant heart rate changes.      FINAL CLINICAL IMPRESSION(S) / ED DIAGNOSES   Final diagnoses:  Shortness of breath  COPD exacerbation (HCC)     Rx / DC Orders   ED Discharge Orders          Ordered    Ambulatory referral to Cardiology        05/02/22 1454    predniSONE (DELTASONE) 20 MG tablet  Daily with breakfast        05/02/22 1515  doxycycline (ADOXA) 100 MG tablet  2 times daily        05/02/22 1515             Note:  This document was prepared using Dragon voice recognition software and may include unintentional dictation errors.   Concha Se, MD 05/02/22 1519    Concha Se, MD 05/02/22 [phone]

## 2022-05-02 NOTE — Discharge Instructions (Addendum)
  Your work-up was reassuring with your CT scan as below.  Not sure if this could be related to your emphysema from smoking and you should try to cut down on your smoking.  We are to start you on a short course of steroids and antibiotics to see if this helps however there is some concern of coronary atherosclerosis on your CT scan.  No signs of a heart attack today given your troponin is negative but you should call your cardiologist to get a follow-up to decide if need to do another catheterization or stress test.  If you develop an acute change in your shortness of breath or develop chest pain with it please return to the ER for repeat evaluation so we can recheck your troponins.    IMPRESSION: 1. No pulmonary embolism.  No active pulmonary disease. 2. One vessel coronary atherosclerosis. 3. Aortic Atherosclerosis (ICD10-I70.0) and Emphysema (ICD10-J43.9).

## 2022-05-02 NOTE — ED Provider Triage Note (Signed)
Emergency Medicine Provider Triage Evaluation Note  Jose Hodge , a 51 y.o. adult  was evaluated in triage.  Pt complains of cough and congestion, shortness of breath.  History of PE.  Review of Systems  Positive: Cough congestion shortness of breath Negative: Fever  Physical Exam  BP 132/88   Pulse (!) 125   Temp 98.1 F (36.7 C)   Resp 18   SpO2 98%  Gen:   Awake, no distress   Resp:  Normal effort  MSK:   Moves extremities without difficulty  Other:    Medical Decision Making  Medically screening exam initiated at 12:23 PM.  Appropriate orders placed.  Brach V Corro was informed that the remainder of the evaluation will be completed by another provider, this initial triage assessment does not replace that evaluation, and the importance of remaining in the ED until their evaluation is complete.     Faythe Ghee, PA-C 05/02/22 1224

## 2023-11-26 ENCOUNTER — Emergency Department: Payer: Medicare Other

## 2023-11-26 ENCOUNTER — Other Ambulatory Visit: Payer: Self-pay

## 2023-11-26 ENCOUNTER — Emergency Department
Admission: EM | Admit: 2023-11-26 | Discharge: 2023-11-26 | Disposition: A | Discharge status: Home / Self Care | Payer: Medicare Other | Attending: Emergency Medicine | Admitting: Emergency Medicine

## 2023-11-26 DIAGNOSIS — J181 Lobar pneumonia, unspecified organism: Secondary | ICD-10-CM | POA: Insufficient documentation

## 2023-11-26 DIAGNOSIS — Z21 Asymptomatic human immunodeficiency virus [HIV] infection status: Secondary | ICD-10-CM | POA: Insufficient documentation

## 2023-11-26 DIAGNOSIS — Z20822 Contact with and (suspected) exposure to covid-19: Secondary | ICD-10-CM | POA: Insufficient documentation

## 2023-11-26 DIAGNOSIS — R079 Chest pain, unspecified: Secondary | ICD-10-CM | POA: Diagnosis present

## 2023-11-26 DIAGNOSIS — I2510 Atherosclerotic heart disease of native coronary artery without angina pectoris: Secondary | ICD-10-CM | POA: Insufficient documentation

## 2023-11-26 DIAGNOSIS — R0789 Other chest pain: Secondary | ICD-10-CM

## 2023-11-26 DIAGNOSIS — J189 Pneumonia, unspecified organism: Secondary | ICD-10-CM

## 2023-11-26 LAB — RESP PANEL BY RT-PCR (RSV, FLU A&B, COVID)  RVPGX2
Influenza A by PCR: NEGATIVE
Influenza B by PCR: NEGATIVE
Resp Syncytial Virus by PCR: NEGATIVE
SARS Coronavirus 2 by RT PCR: NEGATIVE

## 2023-11-26 LAB — BASIC METABOLIC PANEL
Anion gap: 10 (ref 5–15)
BUN: 12 mg/dL (ref 6–20)
CO2: 27 mmol/L (ref 22–32)
Calcium: 8.9 mg/dL (ref 8.9–10.3)
Chloride: 102 mmol/L (ref 98–111)
Creatinine, Ser: 0.88 mg/dL (ref 0.61–1.24)
GFR, Estimated: >60 mL/min (ref >60)
Glucose, Bld: 80 mg/dL (ref 70–99)
Potassium: 3.4 mmol/L — ABNORMAL LOW (ref 3.5–5.1)
Sodium: 139 mmol/L (ref 135–145)

## 2023-11-26 LAB — TROPONIN I (HIGH SENSITIVITY): Troponin I (High Sensitivity): 4 ng/L (ref <18)

## 2023-11-26 LAB — CBC
HCT: 39.8 % (ref 39.0–52.0)
Hemoglobin: 12.9 g/dL — ABNORMAL LOW (ref 13.0–17.0)
MCH: 29.6 pg (ref 26.0–34.0)
MCHC: 32.4 g/dL (ref 30.0–36.0)
MCV: 91.3 fL (ref 80.0–100.0)
Platelets: 348 10*3/uL (ref 150–400)
RBC: 4.36 MIL/uL (ref 4.22–5.81)
RDW: 13.4 % (ref 11.5–15.5)
WBC: 7.5 10*3/uL (ref 4.0–10.5)
nRBC: 0 % (ref 0.0–0.2)

## 2023-11-26 MED ORDER — CEFUROXIME AXETIL 500 MG PO TABS
500.0000 mg | ORAL_TABLET | Freq: Once | ORAL | Status: AC
  Administered 2023-11-26: 500 mg via ORAL
  Filled 2023-11-26: qty 1

## 2023-11-26 MED ORDER — AZITHROMYCIN 250 MG PO TABS: ORAL_TABLET | ORAL | 0 refills | Status: AC

## 2023-11-26 MED ORDER — IOHEXOL 350 MG/ML SOLN
75.0000 mL | Freq: Once | INTRAVENOUS | Status: AC | PRN
  Administered 2023-11-26: 75 mL via INTRAVENOUS

## 2023-11-26 MED ORDER — CEFUROXIME AXETIL 500 MG PO TABS: 500.0000 mg | ORAL_TABLET | Freq: Two times a day (BID) | ORAL | 0 refills | Status: AC

## 2023-11-26 NOTE — ED Provider Notes (Signed)
 [facility] Provider Note    Event Date/Time   First MD Initiated Contact with Patient 11/26/23 2010     (approximate)   History   Chief Complaint Shortness of Breath and Chest Pain   HPI  Jose Hodge is a 53 y.o. adult male who identifies as male with past medical history of CAD and HIV who presents to the ED complaining of chest pain.  Patient reports that she has had dull aching pain in the center of her chest constantly for the past 2 days that is worse when she takes a deep breath.  She reports feeling slightly short of breath, especially when walking.  She denies any fevers, cough, abdominal pain, nausea, vomiting, or diarrhea.  She has not noticed any pain or swelling in her legs, does report history of DVT/PE.      Physical Exam   Triage Vital Signs: ED Triage Vitals  Encounter Vitals Group     BP 11/26/23 2000 132/79     Systolic BP Percentile --      Diastolic BP Percentile --      Pulse Rate 11/26/23 2000 90     Resp 11/26/23 2000 18     Temp 11/26/23 2000 98.2 F (36.8 C)     Temp Source 11/26/23 2000 Oral     SpO2 11/26/23 2000 96 %     Weight 11/26/23 2001 150 lb (68 kg)     Height 11/26/23 2001 6' (1.829 m)     Head Circumference --      Peak Flow --      Pain Score 11/26/23 2001 9     Pain Loc --      Pain Education --      Exclude from Growth Chart --     Most recent vital signs: Vitals:   11/26/23 2000  BP: 132/79  Pulse: 90  Resp: 18  Temp: 98.2 F (36.8 C)  SpO2: 96%    Constitutional: Alert and oriented. Eyes: Conjunctivae are normal. Head: Atraumatic. Nose: No congestion/rhinnorhea. Mouth/Throat: Mucous membranes are moist.  Cardiovascular: Normal rate, regular rhythm. Grossly normal heart sounds.  2+ radial pulses bilaterally. Respiratory: Normal respiratory effort.  No retractions. Lungs CTAB. Gastrointestinal: Soft and nontender. No distention. Musculoskeletal: No lower extremity tenderness nor  edema.  Neurologic:  Normal speech and language. No gross focal neurologic deficits are appreciated.    ED Results / Procedures / Treatments   Labs (all labs ordered are listed, but only abnormal results are displayed) Labs Reviewed  BASIC METABOLIC PANEL - Abnormal; Notable for the following components:      Result Value   Potassium 3.4 (*)    All other components within normal limits  CBC - Abnormal; Notable for the following components:   Hemoglobin 12.9 (*)    All other components within normal limits  RESP PANEL BY RT-PCR (RSV, FLU A&B, COVID)  [id number]  TROPONIN I (HIGH SENSITIVITY)     EKG  ED ECG REPORT I, Carlin Palin, the attending physician, personally viewed and interpreted this ECG.   Date: 11/26/2023  EKG Time: 20:05  Rate: 89  Rhythm: normal sinus rhythm  Axis: Normal  Intervals:none  ST&T Change: None  RADIOLOGY Chest x-ray reviewed and interpreted by me with no infiltrate, edema, or effusion.  PROCEDURES:  Critical Care performed: No  Procedures   MEDICATIONS ORDERED IN ED: Medications  cefUROXime  (CEFTIN ) tablet 500 mg (has no administration in time range)  iohexol  (OMNIPAQUE ) 350  MG/ML injection 75 mL (75 mLs Intravenous Contrast Given 11/26/23 2132)     IMPRESSION / MDM / ASSESSMENT AND PLAN / ED COURSE  I reviewed the triage vital signs and the nursing notes.                              53 y.o. adult with past medical history of CAD and HIV who presents to the ED complaining of dull aching pain in the center of the chest that is worse when taking a deep breath for the past 2 days.  Patient's presentation is most consistent with acute presentation with potential threat to life or bodily function.  Differential diagnosis includes, but is not limited to, ACS, PE, pneumonia, pneumothorax, musculoskeletal pain, GERD, anxiety.  Patient nontoxic-appearing and in no acute distress, vital signs are unremarkable.  EKG shows no evidence of  arrhythmia or ischemia and initial troponin is within normal limits.  Symptoms seem atypical for ACS but patient does report strong history of venous thromboembolism, will check CTA of her chest.  Chest x-ray is unremarkable and remainder of labs are reassuring with no significant anemia, leukocytosis, electrolyte abnormality, or AKI.  COVID and flu testing is negative.  CTA chest is negative for PE, does show developing infiltrate in the left lower lobe which could be the source of patient's symptoms.  Patient mains well-appearing on reassessment, doubt ACS given normal troponin with multiple days of symptoms.  She is appropriate for outpatient management, will prescribe course of antibiotics and have her follow-up with her PCP.  She was counseled to return to the ED for new or worsening symptoms, patient agrees with plan.      FINAL CLINICAL IMPRESSION(S) / ED DIAGNOSES   Final diagnoses:  Atypical chest pain  Pneumonia of left lower lobe due to infectious organism     Rx / DC Orders   ED Discharge Orders          Ordered    cefUROXime  (CEFTIN ) 500 MG tablet  2 times daily with meals        11/26/23 2227    azithromycin  (ZITHROMAX  Z-PAK) 250 MG tablet        11/26/23 2227             Note:  This document was prepared using Dragon voice recognition software and may include unintentional dictation errors.   Willo Dunnings, MD 11/26/23 2239

## 2023-11-26 NOTE — ED Triage Notes (Signed)
 Pt c/o difficulty breathing and chest pain that started 3 days ago. Pt reports he took 2 nitroglycerin today with no relief.

## 2024-07-28 ENCOUNTER — Emergency Department

## 2024-07-28 ENCOUNTER — Encounter: Payer: Self-pay | Admitting: Emergency Medicine

## 2024-07-28 ENCOUNTER — Other Ambulatory Visit: Payer: Self-pay

## 2024-07-28 ENCOUNTER — Emergency Department
Admission: EM | Admit: 2024-07-28 | Discharge: 2024-07-28 | Disposition: A | Discharge status: Home / Self Care | Attending: Emergency Medicine | Admitting: Emergency Medicine

## 2024-07-28 DIAGNOSIS — R0789 Other chest pain: Secondary | ICD-10-CM | POA: Diagnosis present

## 2024-07-28 DIAGNOSIS — J441 Chronic obstructive pulmonary disease with (acute) exacerbation: Secondary | ICD-10-CM | POA: Insufficient documentation

## 2024-07-28 DIAGNOSIS — Z21 Asymptomatic human immunodeficiency virus [HIV] infection status: Secondary | ICD-10-CM | POA: Diagnosis not present

## 2024-07-28 DIAGNOSIS — R059 Cough, unspecified: Secondary | ICD-10-CM | POA: Insufficient documentation

## 2024-07-28 DIAGNOSIS — I2510 Atherosclerotic heart disease of native coronary artery without angina pectoris: Secondary | ICD-10-CM | POA: Diagnosis not present

## 2024-07-28 DIAGNOSIS — F17200 Nicotine dependence, unspecified, uncomplicated: Secondary | ICD-10-CM | POA: Diagnosis not present

## 2024-07-28 DIAGNOSIS — R079 Chest pain, unspecified: Secondary | ICD-10-CM

## 2024-07-28 DIAGNOSIS — R058 Other specified cough: Secondary | ICD-10-CM

## 2024-07-28 DIAGNOSIS — R0602 Shortness of breath: Secondary | ICD-10-CM

## 2024-07-28 LAB — BASIC METABOLIC PANEL WITH GFR
Anion gap: 7 (ref 5–15)
BUN: 9 mg/dL (ref 6–20)
CO2: 30 mmol/L (ref 22–32)
Calcium: 8.9 mg/dL (ref 8.9–10.3)
Chloride: 100 mmol/L (ref 98–111)
Creatinine, Ser: 1.2 mg/dL (ref 0.61–1.24)
GFR, Estimated: >60 mL/min (ref >60)
Glucose, Bld: 84 mg/dL (ref 70–99)
Potassium: 3.6 mmol/L (ref 3.5–5.1)
Sodium: 137 mmol/L (ref 135–145)

## 2024-07-28 LAB — CBC
HCT: 40.8 % (ref 39.0–52.0)
Hemoglobin: 13.1 g/dL (ref 13.0–17.0)
MCH: 29.6 pg (ref 26.0–34.0)
MCHC: 32.1 g/dL (ref 30.0–36.0)
MCV: 92.3 fL (ref 80.0–100.0)
Platelets: 179 K/uL (ref 150–400)
RBC: 4.42 MIL/uL (ref 4.22–5.81)
RDW: 13.9 % (ref 11.5–15.5)
WBC: 4.2 K/uL (ref 4.0–10.5)
nRBC: 0 % (ref 0.0–0.2)

## 2024-07-28 LAB — TROPONIN I (HIGH SENSITIVITY): Troponin I (High Sensitivity): 4 ng/L (ref <18)

## 2024-07-28 LAB — RESP PANEL BY RT-PCR (RSV, FLU A&B, COVID)  RVPGX2
Influenza A by PCR: NEGATIVE
Influenza B by PCR: NEGATIVE
Resp Syncytial Virus by PCR: NEGATIVE
SARS Coronavirus 2 by RT PCR: NEGATIVE

## 2024-07-28 MED ORDER — PREDNISONE 10 MG (21) PO TBPK: ORAL_TABLET | ORAL | 0 refills | Status: AC

## 2024-07-28 MED ORDER — ALBUTEROL SULFATE HFA 108 (90 BASE) MCG/ACT IN AERS: 2.0000 | INHALATION_SPRAY | Freq: Four times a day (QID) | RESPIRATORY_TRACT | 2 refills | Status: AC | PRN

## 2024-07-28 MED ORDER — METHYLPREDNISOLONE SODIUM SUCC 125 MG IJ SOLR
125.0000 mg | Freq: Once | INTRAMUSCULAR | Status: AC
  Administered 2024-07-28: 125 mg via INTRAMUSCULAR
  Filled 2024-07-28: qty 2

## 2024-07-28 MED ORDER — IPRATROPIUM-ALBUTEROL 0.5-2.5 (3) MG/3ML IN SOLN
6.0000 mL | Freq: Once | RESPIRATORY_TRACT | Status: AC
  Administered 2024-07-28: 6 mL via RESPIRATORY_TRACT
  Filled 2024-07-28: qty 3

## 2024-07-28 MED ORDER — AEROCHAMBER MV MISC: 0 refills | Status: AC

## 2024-07-28 NOTE — ED Provider Notes (Signed)
 [facility] Provider Note   Event Date/Time   First MD Initiated Contact with Patient 07/28/24 1350     (approximate) History  Chest Pain  HPI Olamide V Gibbard is a 53 y.o. adult with a stated past medical history of HIV on [place], COPD with continued tobacco abuse, and CAD who presents complaining of chest tightness with associated nonproductive cough and shortness of breath that has been worsening since last night around midnight.  Patient denies any recent travel, sick contacts, or food out of the ordinary.  Patient denies any fevers or chills.  Patient endorses dyspnea on exertion.  Patient denies positional change in shortness of breath. ROS: Patient currently denies any vision changes, tinnitus, difficulty speaking, facial droop, sore throat, abdominal pain, nausea/vomiting/diarrhea, dysuria, or weakness/numbness/paresthesias in any extremity   Physical Exam  Triage Vital Signs: ED Triage Vitals  Encounter Vitals Group     BP 07/28/24 1321 122/83     Girls Systolic BP Percentile --      Girls Diastolic BP Percentile --      Boys Systolic BP Percentile --      Boys Diastolic BP Percentile --      Pulse Rate 07/28/24 1321 96     Resp 07/28/24 1321 18     Temp 07/28/24 1321 98.8 F (37.1 C)     Temp Source 07/28/24 1321 Oral     SpO2 07/28/24 1321 95 %     Weight 07/28/24 1317 150 lb (68 kg)     Height 07/28/24 1317 6' (1.829 m)     Head Circumference --      Peak Flow --      Pain Score 07/28/24 1317 8     Pain Loc --      Pain Education --      Exclude from Growth Chart --    Most recent vital signs: Vitals:   07/28/24 1321 07/28/24 1402  BP: 122/83 114/76  Pulse: 96   Resp: 18 14  Temp: 98.8 F (37.1 C)   SpO2: 95%    General: Awake, oriented x4. CV:  Good peripheral perfusion. Resp:  Normal effort.  Inspiratory and expiratory wheezing over bilateral lung fields Abd:  No distention. Other:  Middle-aged well-developed, well-nourished  African-American male resting comfortably in no acute distress ED Results / Procedures / Treatments  Labs (all labs ordered are listed, but only abnormal results are displayed) Labs Reviewed  RESP PANEL BY RT-PCR (RSV, FLU A&B, COVID)  [id number]  BASIC METABOLIC PANEL WITH GFR  CBC  TROPONIN I (HIGH SENSITIVITY)   EKG ED ECG REPORT I, Artist MARLA Kerns, the attending physician, personally viewed and interpreted this ECG. Date: 07/28/2024 EKG Time: 1318 Rate: 100 Rhythm: Tachycardic sinus rhythm QRS Axis: normal Intervals: normal ST/T Wave abnormalities: normal Narrative Interpretation: Tachycardic sinus rhythm.  No evidence of acute ischemia RADIOLOGY ED MD interpretation: 2 view chest x-ray independently interpreted and shows hyperinflated lungs consistent with emphysema - All radiology independently interpreted and agree with radiology assessment Official radiology report(s): DG Chest 2 View Result Date: 07/28/2024 CLINICAL DATA:  Three day history of chest tightness associated with cough and shortness of breath EXAM: CHEST - 2 VIEW COMPARISON:  Chest radiograph dated 11/26/2023 FINDINGS: Hyperinflated lungs. No focal consolidations. No pleural effusion or pneumothorax. The heart size and mediastinal contours are within normal limits. No acute osseous abnormality. IMPRESSION: Hyperinflated lungs, which can be seen in the setting of emphysema. No focal consolidations. Electronically Signed  By: Limin  Xu M.D.   On: 07/28/2024 14:02   PROCEDURES: Critical Care performed: No Procedures MEDICATIONS ORDERED IN ED: Medications  methylPREDNISolone  sodium succinate (SOLU-MEDROL ) 125 mg/2 mL injection 125 mg (has no administration in time range)  ipratropium-albuterol (DUONEB) 0.5-2.5 (3) MG/3ML nebulizer solution 6 mL (6 mLs Nebulization Given 07/28/24 1414)   IMPRESSION / MDM / ASSESSMENT AND PLAN / ED COURSE  I reviewed the triage vital signs and the nursing notes.                              The patient is on the cardiac monitor to evaluate for evidence of arrhythmia and/or significant heart rate changes. Patient's presentation is most consistent with acute presentation with potential threat to life or bodily function. Patient is a 53 year old male with the above-stated past medical history who presents for chest tightness, shortness of breath, and dyspnea on exertion that is been present over the last 24 hours. The patient appears to be suffering from a moderate exacerbation of COPD.  Based on the history, exam, CXR/EKG, and further workup I don't suspect any other emergent cause of this presentation, such as pneumonia, acute coronary syndrome, congestive heart failure, pulmonary embolism, or pneumothorax.  ED Interventions: bronchodilators, steroids, reassess  1443 Reassessment: After treatment, the patient's shortness of breath is resolved, and their lung exam has returned to baseline. They are comfortable and want to go home.  Rx: Steroids, Albuterol, AeroChamber Disposition: Discharge home with SRP. PCP follow up recommended in next 48hours.   FINAL CLINICAL IMPRESSION(S) / ED DIAGNOSES   Final diagnoses:  COPD with acute exacerbation (HCC)  Chest pain, unspecified type  Nonproductive cough  Shortness of breath   Rx / DC Orders   ED Discharge Orders          Ordered    albuterol (VENTOLIN HFA) 108 (90 Base) MCG/ACT inhaler  Every 6 hours PRN        07/28/24 1428    Spacer/Aero-Holding Chambers (AEROCHAMBER MV) inhaler        07/28/24 1428    predniSONE  (STERAPRED UNI-PAK 21 TAB) 10 MG (21) TBPK tablet        07/28/24 1428           Note:  This document was prepared using Dragon voice recognition software and may include unintentional dictation errors.   Karsynn Deweese K, MD 07/28/24 1435

## 2024-07-28 NOTE — Discharge Instructions (Addendum)
 Please follow-up with your primary care provider for reevaluation.  You have been prescribed an albuterol inhaler as well as steroids to help with your symptoms.  Return to the emergency department with any new or worsening symptoms.

## 2024-07-28 NOTE — ED Provider Notes (Signed)
  Physical Exam  BP 117/78   Pulse 82   Temp 98.8 F (37.1 C) (Oral)   Resp (!) 9   Ht 6' (1.829 m)   Wt 68 kg   SpO2 100%   BMI 20.34 kg/m   Physical Exam  Procedures  Procedures  ED Course / MDM   Clinical Course as of 07/28/24 1550  Sat Jul 28, 2024  1514 S/o from Dr. Jossie: - 53 year old patient with history of HIV on antiretroviral therapy presents for evaluation of chest discomfort, shortness of breath, cough -Cardiac workup unremarkable, no evidence of underlying ammonia -Viral swab pending  To do: - Follow-up viral swab, anticipate discharge home [MM]  1527 Viral swab negative [MM]  1550 Patient reevaluated, remains satting well on room air with normal work of breathing.  Presentation most consistent with COPD exacerbation, discussed albuterol, steroids, PMD follow-up.  Feels very well and amenable to discharge home.  ED return precautions in place.  Patient agrees with plan.  No evidence of other acute pathology at this time. [MM]    Clinical Course User Index [MM] Clarine Ozell LABOR, MD   Medical Decision Making Amount and/or Complexity of Data Reviewed Labs: ordered. Radiology: ordered.  Risk Prescription drug management.          Clarine Ozell LABOR, MD 07/28/24 1550

## 2024-07-28 NOTE — ED Triage Notes (Signed)
 Pt via POV from home. Pt c/o generalized chest tightness, non-radiating for the past couple of days that got worse last night around midnight. Reports cough and SOB. States cough is non-productive. States he has had a MI in the past in the early 2000s and takes Plavix. Pt is A&Ox4 and NAD, ambulatory to triage
